# Patient Record
Sex: Female | Born: 2000 | Race: White | Hispanic: No | Marital: Single
Health system: Midwestern US, Community
[De-identification: ages and names within clinical notes are randomized; demographics above are authoritative.]

## PROBLEM LIST (undated history)

## (undated) DIAGNOSIS — J45909 Unspecified asthma, uncomplicated: Secondary | ICD-10-CM

## (undated) HISTORY — PX: TONSILLECTOMY: SUR1361

---
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
## 2020-01-11 ENCOUNTER — Ambulatory Visit: Payer: BC Managed Care – PPO | Attending: Internal Medicine

## 2020-01-11 DIAGNOSIS — Z23 Encounter for immunization: Secondary | ICD-10-CM

## 2020-01-11 NOTE — Progress Notes (Signed)
   Covid-19 Vaccination Clinic  Name:  Alexandra Sherman    MRN: [id number] DOB: 01-Nov-2000  01/11/2020  [name] was observed post Covid-19 immunization for 15 minutes without incident. She was provided with Vaccine Information Sheet and instruction to access the V-Safe system.   [name] was instructed to call 911 with any severe reactions post vaccine: Marland Kitchen Difficulty breathing  . Swelling of face and throat  . A fast heartbeat  . A bad rash all over body  . Dizziness and weakness   Immunizations Administered    Name Date Dose VIS Date Route   Pfizer COVID-19 Vaccine 01/11/2020  5:51 PM 0.3 mL 10/05/2019 Intramuscular   Manufacturer: [employer], [employer]   Lot: [id number]   NDC: [address]-4

## 2020-01-12 ENCOUNTER — Ambulatory Visit: Payer: BC Managed Care – PPO | Attending: Internal Medicine

## 2020-02-01 ENCOUNTER — Ambulatory Visit: Payer: BC Managed Care – PPO | Attending: Internal Medicine

## 2020-02-01 DIAGNOSIS — Z23 Encounter for immunization: Secondary | ICD-10-CM

## 2020-02-01 NOTE — Progress Notes (Signed)
   Covid-19 Vaccination Clinic  Name:  Yashika Mask    MRN: [id number] DOB: April 28, 2001  02/01/2020  Ms. Meyerhoff was observed post Covid-19 immunization for 15 minutes without incident. She was provided with Vaccine Information Sheet and instruction to access the V-Safe system.   Ms. Bias was instructed to call 911 with any severe reactions post vaccine: Marland Kitchen Difficulty breathing  . Swelling of face and throat  . A fast heartbeat  . A bad rash all over body  . Dizziness and weakness   Immunizations Administered    Name Date Dose VIS Date Route   Pfizer COVID-19 Vaccine 02/01/2020  4:43 PM 0.3 mL 10/05/2019 Intramuscular   Manufacturer: [employer], [employer]   Lot: [id number]   NDC: [address]-0

## 2020-06-30 IMAGING — CR DG SKULL 1-3V
2 series · 2 of 2 positions shown · non-contrast
Comparison: None.

CLINICAL DATA: Foreign body to right ear.

EXAM:
SKULL - 1-3 VIEW

[skull pa]
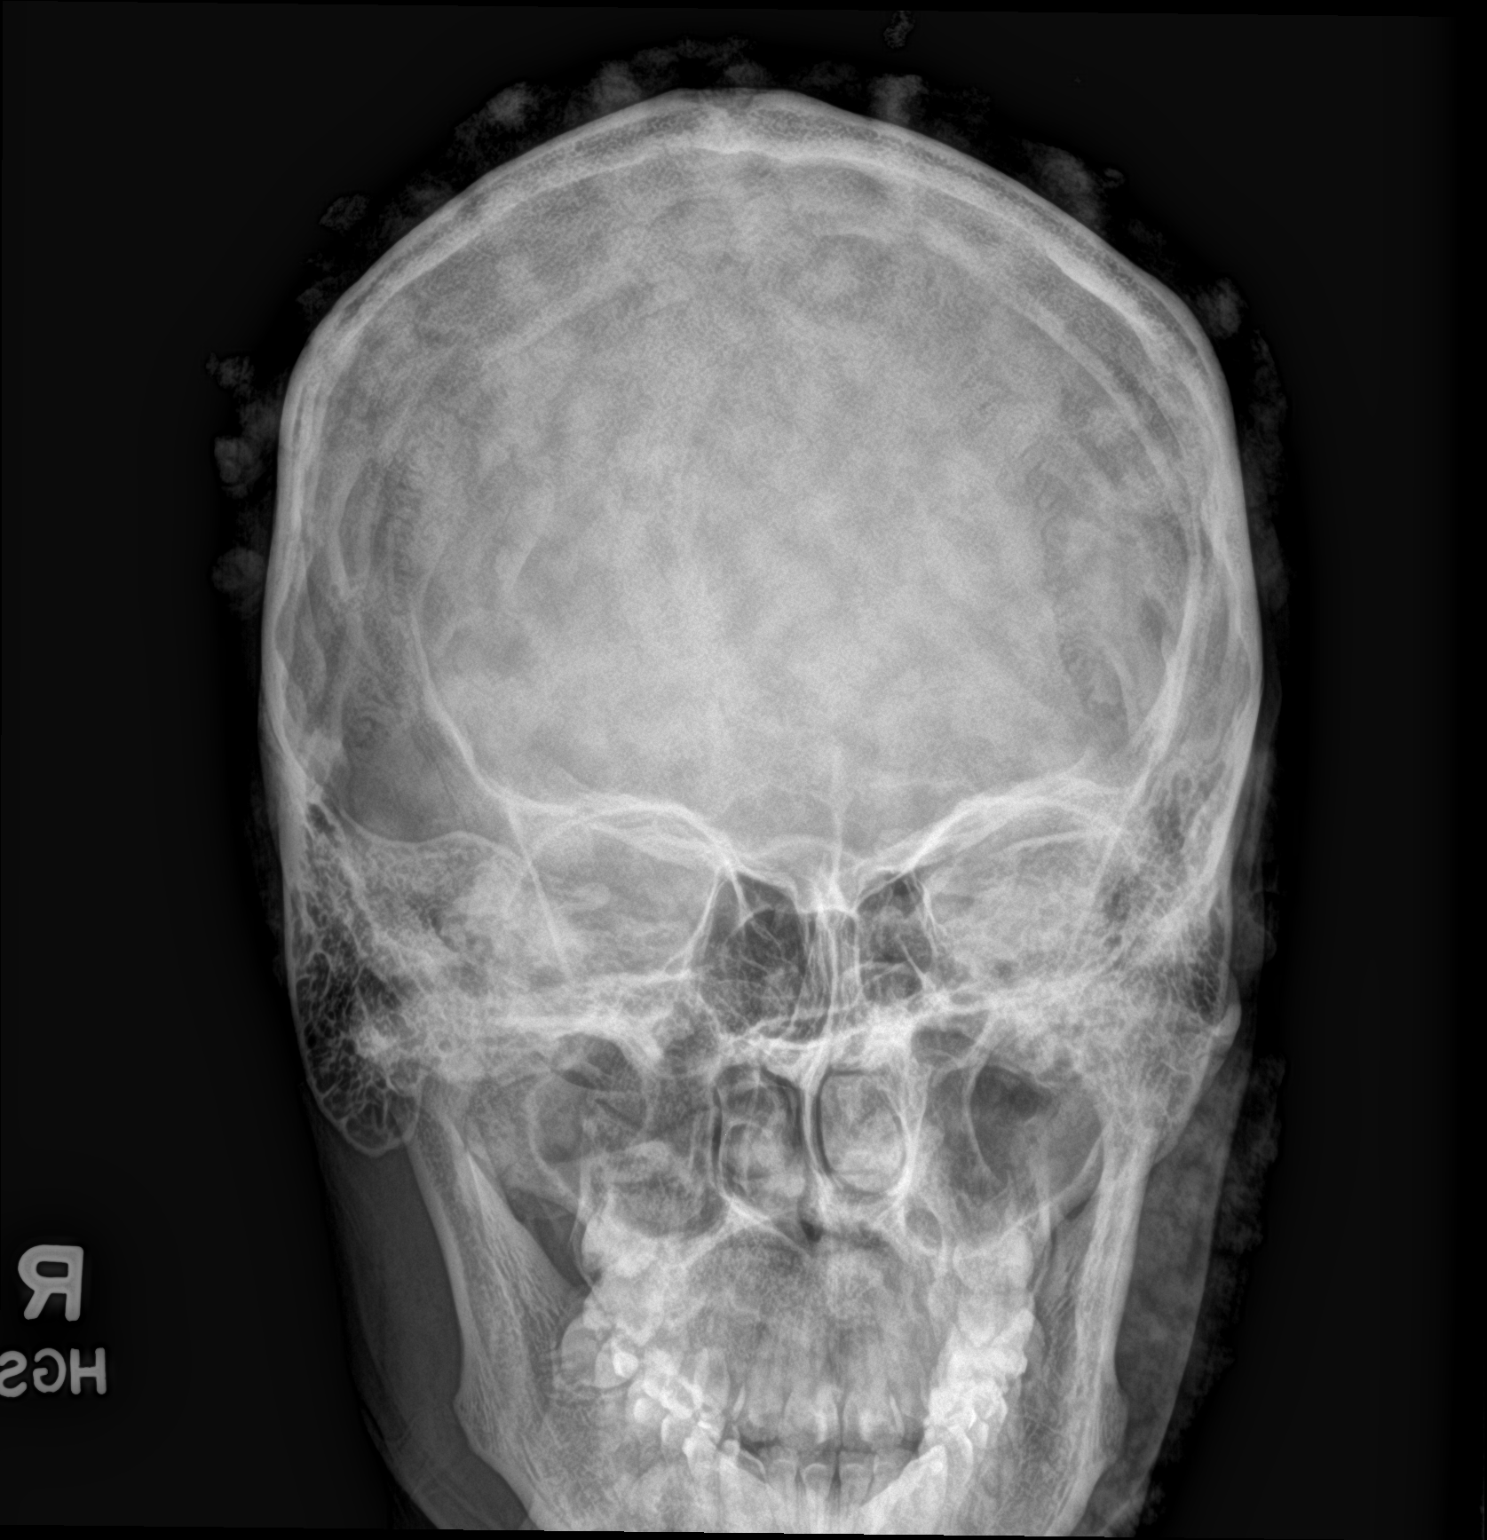

[skull lat]
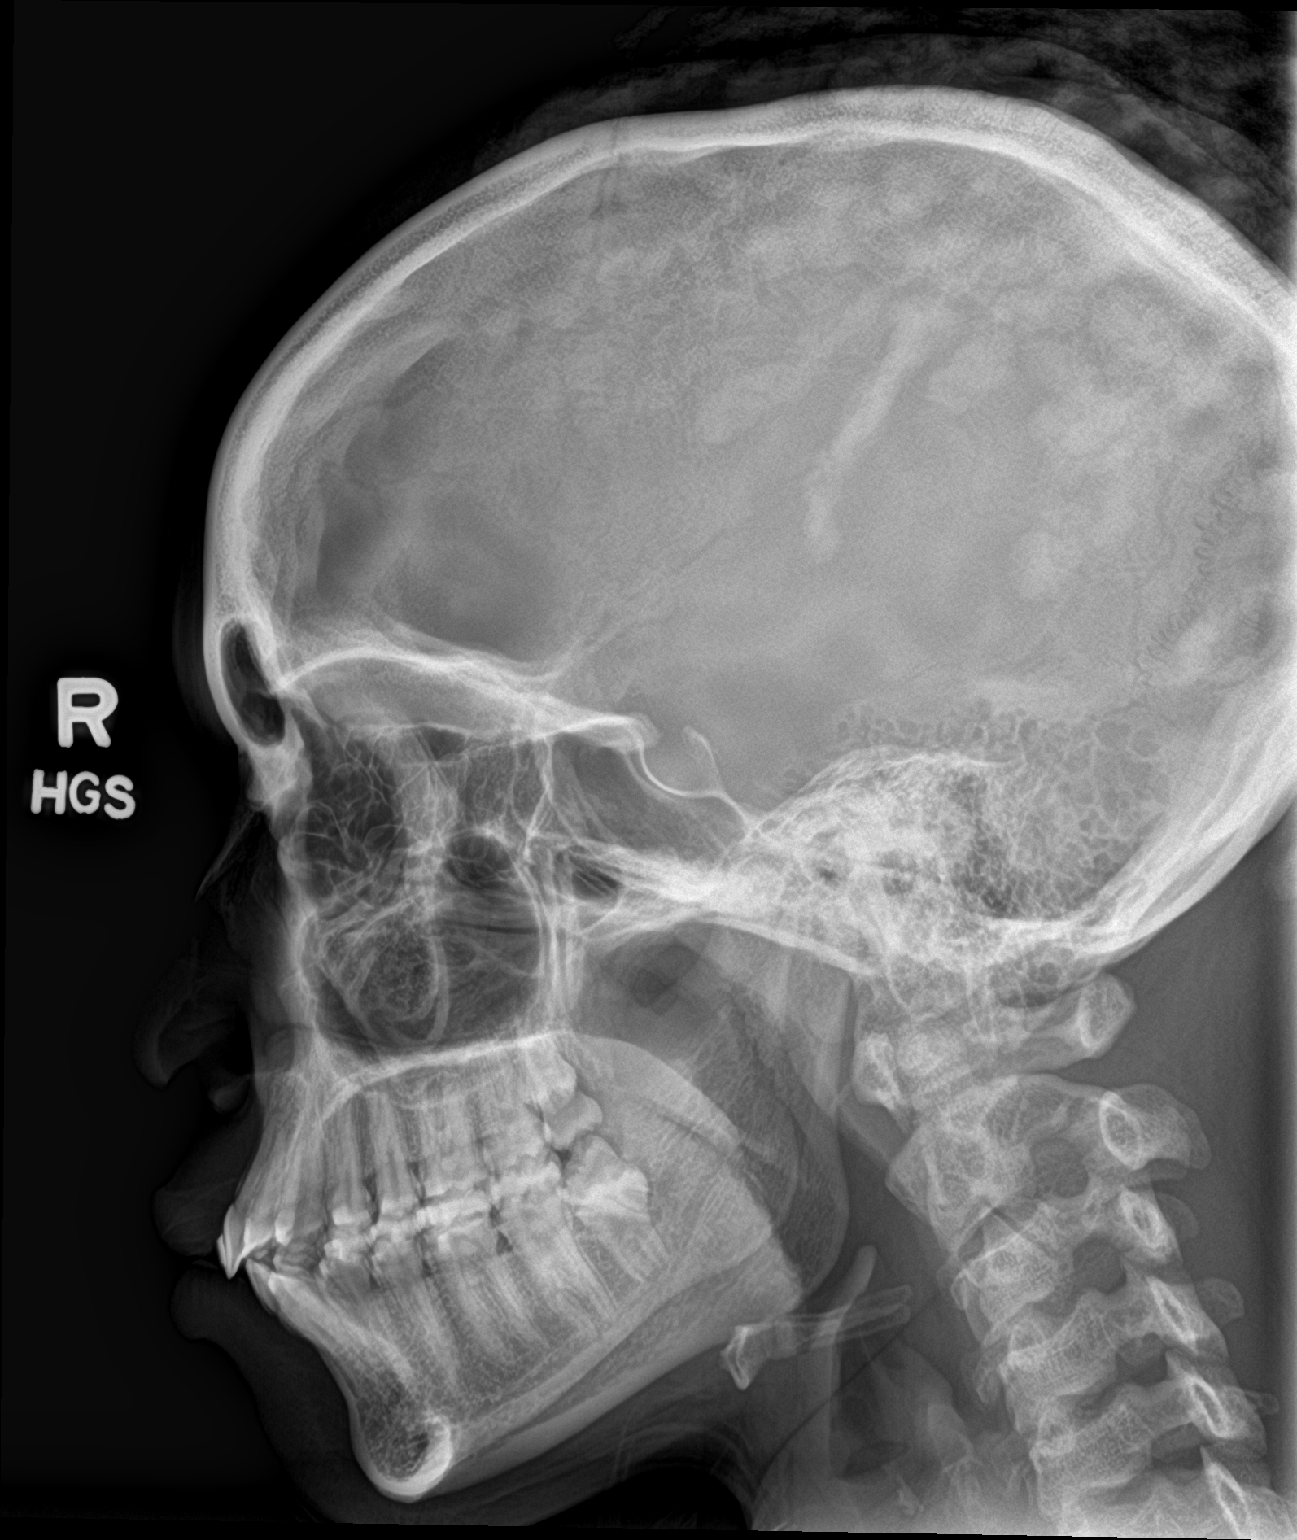

[2 of 2 positions shown; findings below may reference images not displayed]

FINDINGS: There is no evidence of skull fracture or other focal bone lesions.
No radiopaque foreign body identified.
IMPRESSION: No radiopaque foreign body identified on this study.

## 2023-03-27 ENCOUNTER — Inpatient Hospital Stay
Admit: 2023-03-27 | Discharge: 2023-03-27 | Disposition: A | Discharge status: Home / Self Care | Payer: PRIVATE HEALTH INSURANCE | Attending: Emergency Medicine

## 2023-03-27 ENCOUNTER — Emergency Department: Admit: 2023-03-27 | Payer: PRIVATE HEALTH INSURANCE

## 2023-03-27 DIAGNOSIS — R072 Precordial pain: Secondary | ICD-10-CM

## 2023-03-27 DIAGNOSIS — R079 Chest pain, unspecified: Secondary | ICD-10-CM

## 2023-03-27 LAB — CBC WITH AUTO DIFFERENTIAL
Basophils %: 0.6 %
Basophils Absolute: 0.1 10*3/uL (ref 0.0–0.2)
Eosinophils %: 0.8 %
Eosinophils Absolute: 0.1 10*3/uL (ref 0.0–0.6)
Hematocrit: 38.6 % (ref 36.0–48.0)
Hemoglobin: 13.2 g/dL (ref 12.0–16.0)
Lymphocytes %: 22.7 %
Lymphocytes Absolute: 2.1 10*3/uL (ref 1.0–5.1)
MCH: 29.7 pg (ref 26.0–34.0)
MCHC: 34.1 g/dL (ref 31.0–36.0)
MCV: 87.3 fL (ref 80.0–100.0)
MPV: 7.8 fL (ref 5.0–10.5)
Monocytes %: 8.6 %
Monocytes Absolute: 0.8 10*3/uL (ref 0.0–1.3)
Neutrophils %: 67.3 %
Neutrophils Absolute: 6.1 10*3/uL (ref 1.7–7.7)
Platelets: 213 10*3/uL (ref 135–450)
RBC: 4.43 M/uL (ref 4.00–5.20)
RDW: 12.4 % (ref 12.4–15.4)
WBC: 9.1 10*3/uL (ref 4.0–11.0)

## 2023-03-27 LAB — COMPREHENSIVE METABOLIC PANEL W/ REFLEX TO MG FOR LOW K
ALT: <5 U/L — ABNORMAL LOW (ref 10–40)
AST: 13 U/L — ABNORMAL LOW (ref 15–37)
Albumin/Globulin Ratio: 1.5 (ref 1.1–2.2)
Albumin: 4.4 g/dL (ref 3.4–5.0)
Alkaline Phosphatase: 68 U/L (ref 40–129)
Anion Gap: 10 (ref 3–16)
BUN: 15 mg/dL (ref 7–20)
CO2: 24 mmol/L (ref 21–32)
Calcium: 9.7 mg/dL (ref 8.3–10.6)
Chloride: 103 mmol/L (ref 99–110)
Creatinine: 0.7 mg/dL (ref 0.6–1.1)
Est, Glom Filt Rate: >90 (ref >60)
Glucose: 103 mg/dL — ABNORMAL HIGH (ref 70–99)
Potassium reflex Magnesium: 3.8 mmol/L (ref 3.5–5.1)
Sodium: 137 mmol/L (ref 136–145)
Total Bilirubin: 0.5 mg/dL (ref 0.0–1.0)
Total Protein: 7.3 g/dL (ref 6.4–8.2)

## 2023-03-27 LAB — TROPONIN: Troponin, High Sensitivity: <6 ng/L (ref 0–14)

## 2023-03-27 LAB — HCG, SERUM, QUALITATIVE: Preg, Serum: NEGATIVE

## 2023-03-27 LAB — D-DIMER, QUANTITATIVE: D-Dimer, Quant: <0.27 ug/mL FEU (ref 0.00–0.60)

## 2023-03-27 MED ORDER — KETOROLAC TROMETHAMINE 15 MG/ML IJ SOLN
15 | Freq: Once | INTRAMUSCULAR | Status: AC
Start: 2023-03-27 — End: 2023-03-27
  Administered 2023-03-27: 19:00:00 15 mg via INTRAVENOUS

## 2023-03-27 MED FILL — KETOROLAC TROMETHAMINE 15 MG/ML IJ SOLN: 15 MG/ML | INTRAMUSCULAR | Qty: 1

## 2023-03-27 NOTE — ED Provider Notes (Signed)
[facility]     EMERGENCY DEPARTMENT ENCOUNTER            Pt Name: Helen Logan   MRN: [id number]   Birthdate 07-27-2001   Date of evaluation: 03/27/2023   Provider: Rudell Cobb, MD   PCP: No primary care provider on file.   Note Started: 2:24 PM EDT 03/27/23          CHIEF COMPLAINT     Chief Complaint   Patient presents with    Chest Pain     Midsternal chest pain/worse with movement      HISTORY OF PRESENT ILLNESS:   History from : Patient   Limitations to history : None     Helen Logan is a 22 y.o. female who presents complaining of substernal chest pain worse with movement.  Patient states that symptoms started this morning when she woke up.  Has never had pain like this before.  Denies fevers or cough.  Denies leg pain or leg swelling.  Has not taken anything for her symptoms.  States that she also woke up feeling like she was having an asthma exacerbation and used her inhaler and is no longer feeling like she has an asthma exacerbation but is still having chest pain.  She denies any other complaints or concerns.    Nursing Notes were all reviewed and agreed with, or any disagreements were addressed in the HPI.     REVIEW OF SYSTEMS :    Positives and Pertinent negatives as per HPI.      MEDICAL HISTORY   has no past medical history on file.    History reviewed. No pertinent surgical history.   CURRENTMEDICATIONS       There are no discharge medications for this patient.     SCREENINGS          Glasgow Coma Scale  Eye Opening: Spontaneous  Best Verbal Response: Oriented  Best Motor Response: Obeys commands  Glasgow Coma Scale Score: 15        Heart Score for chest pain patients  History: Slightly Suspicious  ECG: Normal  Patient Age: < 45 years  Risk Factors: No risk factors known  Troponin: < 1X normal limit  Heart Score Total: 0       CIWA Assessment  BP: 126/81  Pulse: 94                  PHYSICAL EXAM :  ED Triage Vitals   BP Temp Temp src Pulse Resp SpO2  Height Weight   -- -- -- -- -- -- -- --      GENERAL APPEARANCE: Awake and alert. Cooperative. No acute distress.  HEAD: Normocephalic. Atraumatic.  EYES: PERRL. EOM's grossly intact.   ENT: Mucous membranes are moist.   NECK: Supple, trachea midline.   HEART: RRR. Normal S1, S2. No murmurs, rubs or gallops.   LUNGS: Respirations unlabored. CTAB. Good air exchange. No wheezes, rales, or rhonchi.  Speaking comfortably in full sentences.   ABDOMEN: Soft. Non-distended. Non-tender. No guarding or rebound.   EXTREMITIES: No peripheral edema. No acute deformities.  SKIN: Warm and dry. No acute rashes.   NEUROLOGICAL: Alert and oriented  PSYCHIATRIC: Normal mood and affect.    DIAGNOSTIC RESULTS     LABS:   Labs Reviewed   COMPREHENSIVE METABOLIC PANEL W/ REFLEX TO MG FOR LOW K - Abnormal; Notable for the following components:       Result Value  Glucose 103 (*)     ALT <5 (*)     AST 13 (*)     All other components within normal limits   CBC WITH AUTO DIFFERENTIAL   TROPONIN   D-DIMER, QUANTITATIVE   HCG, SERUM, QUALITATIVE      When ordered only abnormal lab results are displayed. All other labs were within normal range or not returned as of this dictation.     RADIOLOGY:      Non-plain film images such as CT, Ultrasound and MRI are read by the radiologist. Plain radiographic images are visualized and preliminarily interpreted by the ED Provider with the below findings:   Interpretation per the Radiologist below, if available at the time of this note:  XR CHEST PORTABLE   Final Result      Minor atelectatic changes left lower lung      Electronically signed by MD Katina Degree         EKG: The Ekg interpreted by me shows  normal sinus rhythm with a rate of 83  Axis is   Normal  QTc is  normal  Intervals and Durations are unremarkable.      ST Segments: normal  No previous available for comparison    PROCEDURES   Unless otherwise noted below, none     CRITICAL CARE TIME   I personally saw the patient and independently  provided 0 minutes of non-concurrent critical care out of the total shared critical care time excluding separately billable procedures.        Vitals:    Vitals:    03/27/23 1429 03/27/23 1501 03/27/23 1510 03/27/23 1537   BP: 122/73 126/81     Pulse: 79   94   Resp: 10      Temp: 98.2 F (36.8 C)      TempSrc: Oral      SpO2: 100%  100%    Weight: 70.5 kg (155 lb 8 oz)      Height: 1.651 m (5\' 5" )                Is this patient to be included in the SEP-1 Core Measure due to severe sepsis or septic shock?   No   Exclusion criteria - the patient is NOT to be included for SEP-1 Core Measure due to:  Infection is not suspected       CC/HPI Summary, DDx, ED Course, and Reassessment: Patient is a 22 year old female, presenting with concerns for substernal chest pain that she describes as sharp in nature that started earlier this morning.  Was initially feeling short of breath and feeling like she was having an asthma exacerbation however used her inhaler and the symptoms resolved.  Upon arrival in the ED, vitals reassuring.  Patient is resting comfortably and is in no acute distress.  She is afebrile and appears nontoxic.  No reproducible tenderness of the chest wall.  Lungs are clear to auscultation bilaterally.  EKG shows no evidence of acute ischemia or dysrhythmias.  Troponin is negative.  Pregnancy test negative.  No leukocytosis.  Chest x-ray negative for acute concerning findings.  Patient was treated with Toradol here in the ER with some improvement of her symptoms.  Her heart score is a 0.  Given the duration of her symptoms, do not feel that repeat troponin is warranted.  Feel that her symptoms are likely musculoskeletal in nature.  Her D-dimer is within normal limits and do not suspect DVT or pulmonary embolism as  source of her symptoms.  Findings were discussed with the patient and her family member at bedside and they are comfortable and in agreement with plan of care.  Patient will be discharged.  Advised  follow-up with PCP.  Given return precautions for the ED for any new or worsening symptoms.       Patient was given the following medications:   Medications   ketorolac (TORADOL) injection 15 mg (15 mg IntraVENous Given 03/27/23 1525)        CONSULTS:   None   Discussion with Other Professionals: None   {Social Determinants: None   Chronic Conditions:  None    Records Reviewed: NA      Disposition Considerations: Appropriate for outpatient management  I am the Primary Clinician of Record.        FINAL IMPRESSION    1. Chest pain, unspecified type           DISPOSITION/PLAN:     Pt discharged home in stable condition.     PATIENT REFERRED TO:   [facility]  [phone]  Schedule an appointment as soon as possible for a visit       [facility] Emergency Department  [address]  [place] [address]  [phone]  Go to   If symptoms worsen       DISCHARGE MEDICATIONS:   There are no discharge medications for this patient.       DISCONTINUED MEDICATIONS:   There are no discharge medications for this patient.             (Please note that portions of this note were completed with a voice recognition program.  Efforts were made to edit the dictations but occasionally words are mis-transcribed.)       Rudell Cobb, MD (electronically signed)             Rudell Cobb, MD  03/27/23 [phone]

## 2023-03-29 LAB — EKG 12-LEAD
Atrial Rate: 83 {beats}/min
P Axis: 64 degrees
P-R Interval: 142 ms
Q-T Interval: 344 ms
QRS Duration: 70 ms
QTc Calculation (Bazett): 404 ms
R Axis: 48 degrees
T Axis: 43 degrees
Ventricular Rate: 83 {beats}/min

## 2023-09-18 ENCOUNTER — Inpatient Hospital Stay
Admit: 2023-09-18 | Discharge: 2023-09-18 | Disposition: A | Discharge status: Home / Self Care | Payer: PRIVATE HEALTH INSURANCE | Attending: Student in an Organized Health Care Education/Training Program

## 2023-09-18 DIAGNOSIS — S0990XA Unspecified injury of head, initial encounter: Secondary | ICD-10-CM

## 2023-09-18 MED ORDER — ONDANSETRON 4 MG PO TBDP
4 | ORAL_TABLET | Freq: Three times a day (TID) | ORAL | 0 refills | Status: AC | PRN
Start: 2023-09-18 — End: ?

## 2023-09-18 MED ORDER — NAPROXEN 500 MG PO TABS
500 | ORAL_TABLET | Freq: Two times a day (BID) | ORAL | 0 refills | Status: AC
Start: 2023-09-18 — End: ?

## 2023-09-18 MED ORDER — BUTALBITAL-APAP-CAFFEINE 50-300-40 MG PO CAPS
50-300-40 | ORAL_CAPSULE | ORAL | 0 refills | Status: AC | PRN
Start: 2023-09-18 — End: ?

## 2023-09-18 NOTE — ED Provider Notes (Signed)
 Emergency Department Provider Note  Location: [facility]  09/18/2023     Patient Identification  Helen Logan is a 22 y.o. female    Chief Complaint  Head Injury (Pt presents with hitting back of head on the concrete floo

## 2023-09-18 NOTE — Discharge Instructions (Addendum)
 Please return to the emergency department if you develop any new, ongoing or worsening symptoms. We hope you feel better soon!
